# Patient Record
Sex: Male | Born: 1985 | Race: White | Hispanic: No | Marital: Single | Smoking: Never smoker
Health system: Southern US, Community
[De-identification: ages and names within clinical notes are randomized; demographics above are authoritative.]

## PROBLEM LIST (undated history)

## (undated) HISTORY — PX: OTHER SURGICAL HISTORY: SHX169

---

## 2008-10-11 ENCOUNTER — Encounter (INDEPENDENT_AMBULATORY_CARE_PROVIDER_SITE_OTHER): Payer: Self-pay | Admitting: Occupational Medicine

## 2008-10-12 ENCOUNTER — Ambulatory Visit: Payer: Self-pay | Admitting: Family Medicine

## 2008-10-12 DIAGNOSIS — I498 Other specified cardiac arrhythmias: Secondary | ICD-10-CM | POA: Insufficient documentation

## 2008-10-12 DIAGNOSIS — R55 Syncope and collapse: Secondary | ICD-10-CM | POA: Insufficient documentation

## 2008-10-13 ENCOUNTER — Ambulatory Visit: Payer: Self-pay | Admitting: Cardiology

## 2008-10-13 LAB — CONVERTED CEMR LAB
BUN: 19 mg/dL (ref 6–23)
CO2: 24 meq/L (ref 19–32)
Cholesterol: 180 mg/dL (ref 0–200)
Glucose, Bld: 91 mg/dL (ref 70–99)
MCHC: 34.1 g/dL (ref 30.0–36.0)
RDW: 12.9 % (ref 11.5–15.5)
Sodium: 139 meq/L (ref 135–145)
Total Bilirubin: 0.8 mg/dL (ref 0.3–1.2)
Total Protein: 7.6 g/dL (ref 6.0–8.3)
Triglycerides: 85 mg/dL (ref <150)
VLDL: 17 mg/dL (ref 0–40)

## 2008-10-26 ENCOUNTER — Encounter: Payer: Self-pay | Admitting: Cardiology

## 2008-10-26 ENCOUNTER — Ambulatory Visit: Payer: Self-pay

## 2010-05-02 ENCOUNTER — Ambulatory Visit: Payer: Self-pay | Admitting: Family Medicine

## 2010-05-02 DIAGNOSIS — L219 Seborrheic dermatitis, unspecified: Secondary | ICD-10-CM

## 2010-05-03 LAB — CONVERTED CEMR LAB
ALT: 14 units/L (ref 0–53)
CO2: 26 meq/L (ref 19–32)
Calcium: 10.1 mg/dL (ref 8.4–10.5)
Chloride: 103 meq/L (ref 96–112)
Cholesterol: 182 mg/dL (ref 0–200)
Creatinine, Ser: 1.11 mg/dL (ref 0.40–1.50)
GC Probe Amp, Urine: NEGATIVE
Sodium: 137 meq/L (ref 135–145)
Total Protein: 7.9 g/dL (ref 6.0–8.3)

## 2011-01-01 ENCOUNTER — Ambulatory Visit
Admission: RE | Admit: 2011-01-01 | Discharge: 2011-01-01 | Payer: Self-pay | Source: Home / Self Care | Attending: Family Medicine | Admitting: Family Medicine

## 2011-01-01 ENCOUNTER — Encounter: Payer: Self-pay | Admitting: Family Medicine

## 2011-01-01 DIAGNOSIS — S60559A Superficial foreign body of unspecified hand, initial encounter: Secondary | ICD-10-CM | POA: Insufficient documentation

## 2011-01-16 NOTE — Assessment & Plan Note (Signed)
Summary: CPE   Vital Signs:  Patient profile:   25 year old male Height:      75.3 inches Weight:      239 pounds BMI:     29.74 O2 Sat:      99 % on Room air Pulse rate:   60 / minute BP sitting:   136 / 86  (left arm) Cuff size:   large  Vitals Entered By: Payton Spark CMA (May 02, 2010 9:39 AM)  O2 Flow:  Room air CC: CPE. Also check lump on scrotum, has been there unchanged x 2 years.    Primary Care Provider:  Nani Gasser MD  CC:  CPE. Also check lump on scrotum and has been there unchanged x 2 years. .  History of Present Illness: 25 yo WM presents for CPE.  He has noticed a lump on the R side of his scrotum that is hard and unchanged, non tender x 2 yrs.    He is physically active working for [employer].  He admits to a poor diet.  Monogamous with Girlfriend.  He is due for fasting labs and STD testing.  His last Tetanus vaccine was <10 yrs ago.  He has fam hx of premature heart dz.  He is not  a smoker.  Denies fam hx of prostate or colon cancer.  Current Medications (verified): 1)  None  Allergies (verified): No Known Drug Allergies  Past History:  Past Medical History: Reviewed history from 10/12/2008 and no changes required. None  Past Surgical History: Reviewed history from 10/12/2008 and no changes required. Groin hernia surgery bilat at age 51.    Family History: Reviewed history from 10/12/2008 and no changes required. Father wiht MI in his 38s, had a defibrillator Mother with HTN  Social History: Works for [employer]    HS degree. Single.  Has a girlfriend  Never Smoked Alcohol use-yes Drug use-no Regular exercise-yes  Review of Systems  The patient denies anorexia, fever, weight loss, weight gain, vision loss, decreased hearing, hoarseness, chest pain, syncope, dyspnea on exertion, peripheral edema, prolonged cough, headaches, hemoptysis, abdominal pain, melena, hematochezia, severe indigestion/heartburn, hematuria, incontinence, genital sores,  muscle weakness, suspicious skin lesions, transient blindness, difficulty walking, depression, unusual weight change, abnormal bleeding, enlarged lymph nodes, angioedema, breast masses, and testicular masses.    Physical Exam  General:  alert, well-developed, well-nourished, and well-hydrated.   Head:  normocephalic and atraumatic.   Eyes:  conjunctiva clear; PERRLA Ears:  EACs patent; TMs translucent and gray with good cone of light and bony landmarks.  Nose:  no nasal discharge.   Mouth:  good dentition and pharynx pink and moist.   Neck:  no masses.   Lungs:  Normal respiratory effort, chest expands symmetrically. Lungs are clear to auscultation, no crackles or wheezes. Heart:  Normal rate and regular rhythm. S1 and S2 normal without gallop, murmur, click, rub or other extra sounds. Abdomen:  Bowel sounds positive,abdomen soft and non-tender without masses, organomegaly or hernias noted. Genitalia:  circumcised, no hydrocele, no scrotal masses, and no cutaneous lesions.  small R testicular pearly papule - hard Pulses:  2+ radial and pedal pulses Extremities:  no LE edema Skin:  mild forehead/ scalp seborrhea with R temple SKs. flat skin lesion (scar) over the R eyebrow.  No erythema, scaling or vesicular formation.  Cervical Nodes:  No lymphadenopathy noted Psych:  good eye contact.     Impression & Recommendations:  Problem # 1:  PHYSICAL EXAMINATION (ICD-V70.0) Keeping healthy  checklist for men reviewed. BP in the pre-HTN range.  BMI 29= overwt. Tetanus UTD. Update fasting labs.   STD testing done. MVI daily. Healthy diet, regular exercise.  Other Orders: T-Comprehensive Metabolic Panel [id number]) T-Lipid Profile [id number]) T-HIV Antibody  (Reflex) [id number]) T-RPR (Syphilis) [id number]) T-Chlamydia & GC Probe, Urine ([id number])

## 2011-01-18 NOTE — Letter (Signed)
Summary: Out of Work  [facility]  [address]   [place], [place] [address]   Phone: [phone]  Fax: [phone]    January 01, 2011   Employee:  HAEDEN HUDOCK    To Whom It May Concern:   For Medical reasons, please excuse the above named employee from work for the following dates:  Start:   01-01-2011  End:   01-02-2011  If you need additional information, please feel free to contact our office.         Sincerely,    Nani Gasser MD

## 2011-01-18 NOTE — Assessment & Plan Note (Signed)
Summary: Phayrngitis, Foreign Body removal   Vital Signs:  Patient profile:   25 year old male Height:      75.3 inches Weight:      237 pounds Pulse rate:   84 / minute BP sitting:   151 / 82  (right arm) Cuff size:   large  Vitals Entered By: Avon Gully CMA, Duncan Dull) (January 01, 2011 4:18 PM) CC: st since thurday, legs feel achey, throat hurts worse today   Primary Care Provider:  Nani Gasser MD  CC:  st since thurday, legs feel achey, and throat hurts worse today.  History of Present Illness: st since thurday (5 days), legs feel achey, throat hurts worse today. Taking lozenges. Throat itches. No real cough.  Has been worse over the weekend.  No nasal congestion. No fever.  No HA or nausea.     Has a peice of metal in her right hand since July. Went to [facility] after teh injury and had an xray. Saw a peice od metal and was told to give it time. It would likely work its way out voer next couple o fmonths. It moved to the surface but never came out. Tried to take it out home.   Current Medications (verified): 1)  None  Allergies (verified): No Known Drug Allergies  Comments:  Nurse/Medical Assistant: The patient's medications and allergies were reviewed with the patient and were updated in the Medication and Allergy Lists. Avon Gully CMA, Duncan Dull) (January 01, 2011 4:19 PM)  Physical Exam  General:  Well-developed,well-nourished,in no acute distress; alert,appropriate and cooperative throughout examination Head:  Normocephalic and atraumatic without obvious abnormalities. No apparent alopecia or balding. Eyes:  No corneal or conjunctival inflammation noted. EOMI. Perrla.  Ears:  External ear exam shows no significant lesions or deformities.  Otoscopic examination reveals clear canals, tympanic membranes are intact bilaterally without bulging, retraction, inflammation or discharge. Hearing is grossly normal bilaterally. Nose:  External nasal examination  shows no deformity or inflammation.  Mouth:  OP with some mildl erythema.  Neck:  No deformities, masses, or tenderness noted. Lungs:  Normal respiratory effort, chest expands symmetrically. Lungs are clear to auscultation, no crackles or wheezes. Heart:  Normal rate and regular rhythm. S1 and S2 normal without gallop, murmur, click, rub or other extra sounds. Skin:  no rashes. Left hand near the base of his thumb able to palpate the edge of a peice of metal.   Cervical Nodes:  No lymphadenopathy noted Psych:  Cognition and judgment appear intact. Alert and cooperative with normal attention span and concentration. No apparent delusions, illusions, hallucinations   Impression & Recommendations:  Problem # 1:  PHARYNGITIS (ICD-462)  Rapid strep neg. Treat as viral. Call if nto better by the end of the week.    Orders: Rapid Strep (81191)  Problem # 2:  FOREIGN BODY, HAND (ICD-914.6)  See procedue note. Pt toelrated well and metal peiced removed successfull. Steristrips placed and f/u wound care instructions given.   Orders: Removal of Foreign Body Simple (10120)   Orders Added: 1)  Rapid Strep [47829] 2)  Est. Patient Level III [56213] 3)  Removal of Foreign Body Simple [10120]     Procedure Note  Foreign Body Removal: The patient complains of pain and irritation but denies discharge and fever. Date of onset: 06/16/2010 Indication: painful lesion Work related: yes  Procedure # 1: incision & FB removal    Type of foreign body: metal    Region: Left hand  Location: base of thumb     Anesthesia: 1% lidocaine w/epinephrine    Closure: steri-strips  Cleaned and prepped with: alcohol and betadine Instructions: daily dressing changes  Laboratory Results  Date/Time Received: 01/01/11 Date/Time Reported: 01/01/11

## 2011-05-01 NOTE — Assessment & Plan Note (Signed)
[name] [name]                            CARDIOLOGY OFFICE NOTE   NAME:[name], [name] CREMER                        MRN:          [id number]  DATE:10/13/2008                            DOB:          1986/02/18    The patient is a pleasant 25 year old gentleman with no prior cardiac  history and I was asked to evaluate for syncope.  Note, he typically  does not have dyspnea on exertion, orthopnea, PND, pedal edema,  presyncope, syncope, or exertional chest pain.  He does state that after  exercise and occasionally he will feel his heart rate slow down for  approximately 10 seconds as well as beat hard.  However, this is not  associated with any syncope.  This past Sunday which was October 10, 2008, the patient was in the shower.  He was washing his hair with his  head bent over.  After bringing his head up, he developed the same  sensation of his heart rate dropping followed by frank syncopal episode.  There was no associated chest pain, shortness of breath, or nausea or  vomiting.  He did not lose strength or sensation in his extremities nor  he had any seizure activity.  He did hit the back of his neck and  chipped a tooth by his report.  He was out for approximately 10 seconds  to 30 seconds and felt fine afterwards.  Note, he had not had a bowel  movement or had not urinated.  Because of the above, we were asked to  further evaluate.   MEDICATIONS:  The patient is on no medications at present.   ALLERGIES:  He has no known drug allergies.   SOCIAL HISTORY:  He does not smoke.  He rarely consumes alcohol.  He  denies any drug use.   FAMILY HISTORY:  Positive for coronary artery disease in his father.   PAST MEDICAL HISTORY:  There is no diabetes mellitus, hypertension, or  hyperlipidemia.  There is no other medical problems noted.  He has had a  history of bilateral inguinal hernia repair as a child.   REVIEW OF SYSTEMS:  He denies any headaches or  fevers or chills.  There  is no productive cough or hemoptysis.  There is no dysphagia,  odynophagia, melena, or hematochezia.  There is no dysuria or hematuria.  There is no rash or seizure activity.  There is no orthopnea, PND, or  pedal edema.  The remaining systems are negative.   PHYSICAL EXAMINATION:  VITAL SIGNS:  Blood pressure of 130/64 and his  pulse is 56.  GENERAL:  He is well developed, well nourished in no acute distress.  SKIN:  Warm and dry.  He does not appear to be depressed.  There is no  peripheral clubbing.  BACK:  Normal.  HEENT:  Normal with normal eyelids.  NECK:  Supple with a normal upstroke bilaterally.  No bruits noted.  There is no jugular venous distention.  I cannot appreciate thyromegaly.  CHEST:  Clear to auscultation.  Normal expansion.  CARDIOVASCULAR:  Regular rate and rhythm.  Normal S1 and S2.  There are  no murmurs, rubs, or gallops noted.  ABDOMEN:  Nontender and nondistended.  Positive bowel sounds.  No  hepatosplenomegaly.  No mass appreciated.  There is no abdominal bruit.  He has 2+ femoral pulses bilaterally.  No bruits.  EXTREMITIES:  No edema.  I could palpate no cords.  He has 2+ dorsalis  pedis pulses bilaterally.  NEUROLOGIC:  Grossly intact.   His electrocardiogram is not available at this time and we are awaiting  on a transmission from Dr. Shelah Lewandowsky office.   DIAGNOSES:  Recent syncopal episode - [name] [name] had a syncopal episode 3  days ago that sounds to potentially be vagal in etiology.  He states  that he felt his heart rate slow down and then felt dizzy and passed  out.  He has had no subsequent episodes.  We will await his  electrocardiogram.  If it is normal, then we will plan to proceed with  an echocardiogram to quantify his left ventricular function.  If that is  normal, then we will follow him expectantly.  If he has recurrent  episodes in the future, then we may need to proceed with an event  monitor.  I have  instructed him not to drive until he has an  echocardiogram performed. Certainly, his left ventricular function were  reduced and we would need to rethink the above.  I have also instructed  him on the importance of maintaining good hydration and salt intake.  We  will see him back in approximately 12 weeks to review his symptoms.     Madolyn Frieze Jens Som, MD, [facility]  Electronically Signed    BSC/MedQ  DD: 10/13/2008  DT: 10/14/2008  Job #: [id number]   cc:   Nani Gasser, M.D.

## 2011-07-31 ENCOUNTER — Encounter: Payer: Self-pay | Admitting: Cardiology

## 2011-09-25 ENCOUNTER — Ambulatory Visit (INDEPENDENT_AMBULATORY_CARE_PROVIDER_SITE_OTHER): Payer: BC Managed Care – PPO | Admitting: Family Medicine

## 2011-09-25 ENCOUNTER — Encounter: Payer: Self-pay | Admitting: Family Medicine

## 2011-09-25 ENCOUNTER — Ambulatory Visit
Admission: RE | Admit: 2011-09-25 | Discharge: 2011-09-25 | Disposition: A | Discharge status: Home / Self Care | Payer: BC Managed Care – PPO | Source: Ambulatory Visit | Attending: Family Medicine | Admitting: Family Medicine

## 2011-09-25 VITALS — BP 134/67 | HR 96 | Wt 207.0 lb

## 2011-09-25 DIAGNOSIS — N50811 Right testicular pain: Secondary | ICD-10-CM

## 2011-09-25 DIAGNOSIS — N509 Disorder of male genital organs, unspecified: Secondary | ICD-10-CM

## 2011-09-25 LAB — POCT URINALYSIS DIPSTICK
Bilirubin, UA: NEGATIVE
Glucose, UA: NEGATIVE
Leukocytes, UA: NEGATIVE
Nitrite, UA: NEGATIVE

## 2011-09-25 NOTE — Patient Instructions (Signed)
We will call you with the results 

## 2011-09-25 NOTE — Progress Notes (Signed)
  Subjective:    Patient ID: [name] [name], male    DOB: 07-28-1986, 25 y.o.   MRN: [id number]  HPI  Double hernia repair as a child. He works out regularly. Right testicle with lots of pressure, started yesterday. Felt a pain when tried work out this morning. No swelling or bulge.  No discomfort in the groin crease area.  No urinary sxs or fever. No pain relievers. No alleviating sxs. Hasn't taken any meds for pain. More comfortable at rest.   Review of Systems     Objective:   Physical Exam  Right testicle is normal in size and appearance. No nodule or tenderness. Normal palpation of the cords. Neg hernia exam.        Assessment & Plan:  Right testicular pain- Unclear etiology. Exam is normal. No palpable hernia on exam. No urinary sxs and UA is neg.  Will schedule for [place] for further w/u. If neg will refer to urology for further eval. Avoid working out until we get the results back. [name] use aleve or IBU as needed.

## 2012-04-15 ENCOUNTER — Ambulatory Visit (INDEPENDENT_AMBULATORY_CARE_PROVIDER_SITE_OTHER): Payer: BC Managed Care – PPO | Admitting: Family Medicine

## 2012-04-15 ENCOUNTER — Encounter: Payer: Self-pay | Admitting: Family Medicine

## 2012-04-15 VITALS — BP 136/72 | HR 70 | Ht 72.0 in | Wt 211.0 lb

## 2012-04-15 DIAGNOSIS — T148XXA Other injury of unspecified body region, initial encounter: Secondary | ICD-10-CM

## 2012-04-15 NOTE — Patient Instructions (Signed)
Aleve or IBuprofen for inflammation and pain relief.   Keep icing it Call if need extension on the work notes.

## 2012-04-15 NOTE — Progress Notes (Signed)
  Subjective:    Patient ID: [name] [name], male    DOB: 04-25-1986, 26 y.o.   MRN: [id number]  HPI Right thigh pain - Felt pain during his work out and kept going.  Iced it all last night.  Woke up and changed the ice pack.  Still very painful.  Can walk normally but can't do lunge or squat without sig pain.  HAsn't taken Aleve or IBU.  Says normally can stretch, massage and ice of muscle and it usually resolves very quickly. This has not and so he was concerned.   Review of Systems     Objective:   Physical Exam  Constitutional: He appears well-developed.  Musculoskeletal:       Right thigh with normal range of motion of the hip, knee and ankle. Strength is 5 over 5 in the hips, knees, ankles bilaterally. He is nontender over the thigh itself. He does have pain with resisted flexion. No bruising or skin lesions.           Assessment & Plan:  Right Thigh muscle tear - Continue ice and massage the muscle.  Start IBU and Aleve as well.  Call if not better in 1-2 weeks.  Recommend conservative therapy for the next one to 2 weeks. 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 If he needs a couple more days or extension on his work note then I will be happy to do that. Work note given.

## 2012-10-17 IMAGING — US US SCROTUM
1 series · 14 of 25 positions shown · non-contrast
Comparison: None.

CLINICAL DATA: Right testicular pressure with exercise

ULTRASOUND OF SCROTUM
TECHNIQUE: Complete ultrasound examination of the testicles,
epididymis, and other scrotal structures was performed.

[Series 1: us scrotum · 0.08mm/px · 14 of 57 slices shown]
[im 1/57]
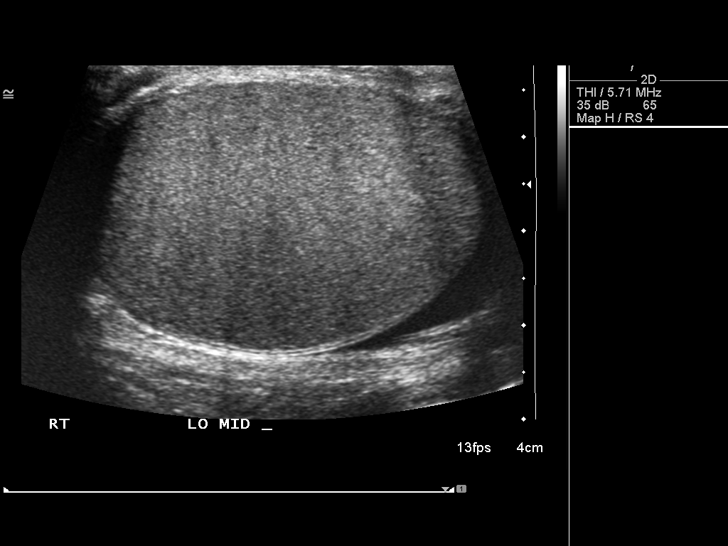
[im 5/57]
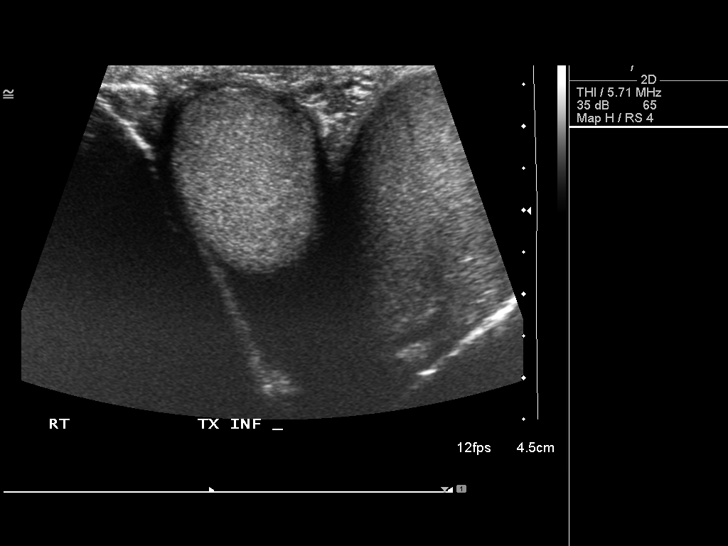
[im 10/57]
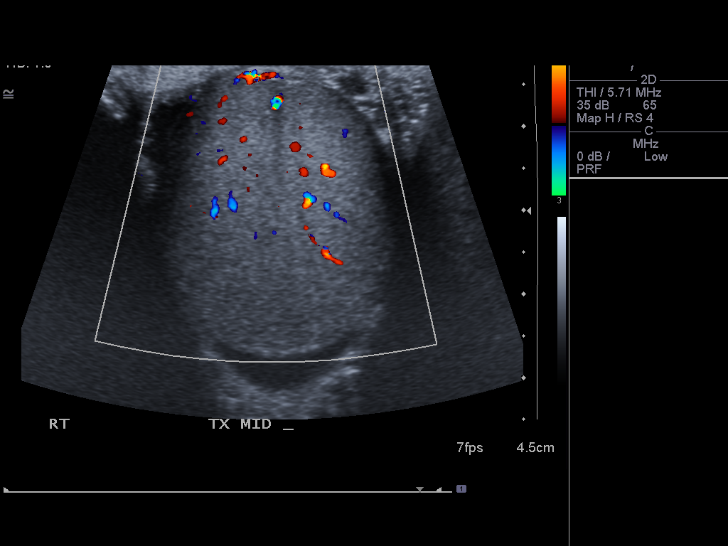
[im 15/57]
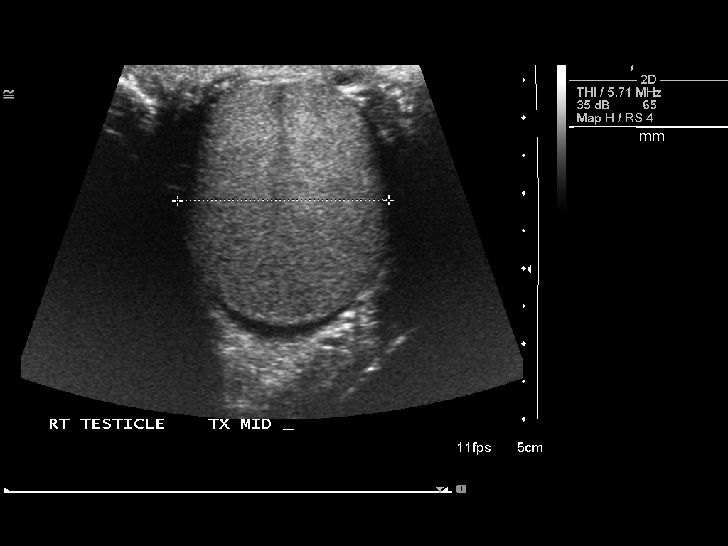
[im 19/57]
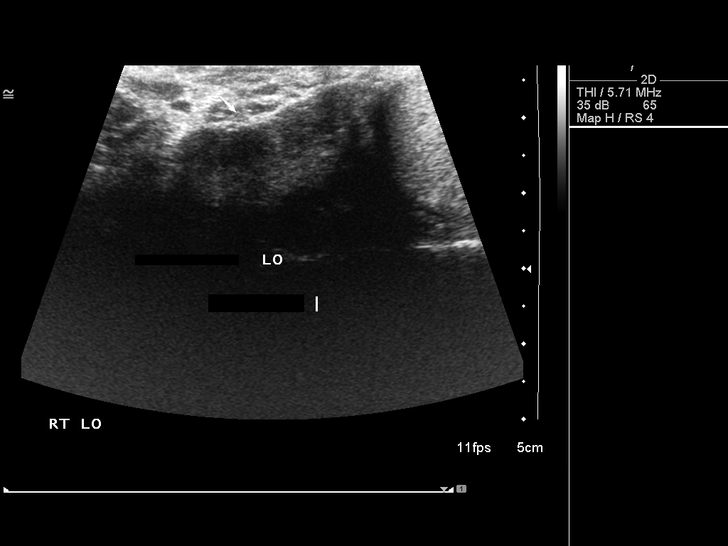
[im 22/57]
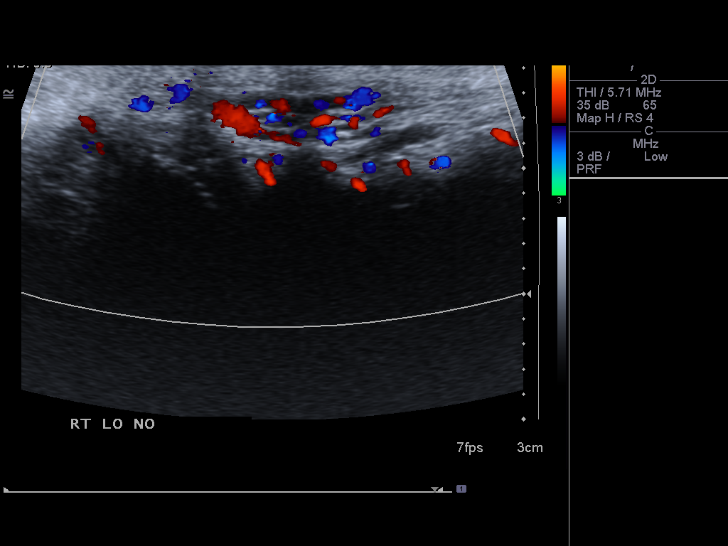
[im 26/57]
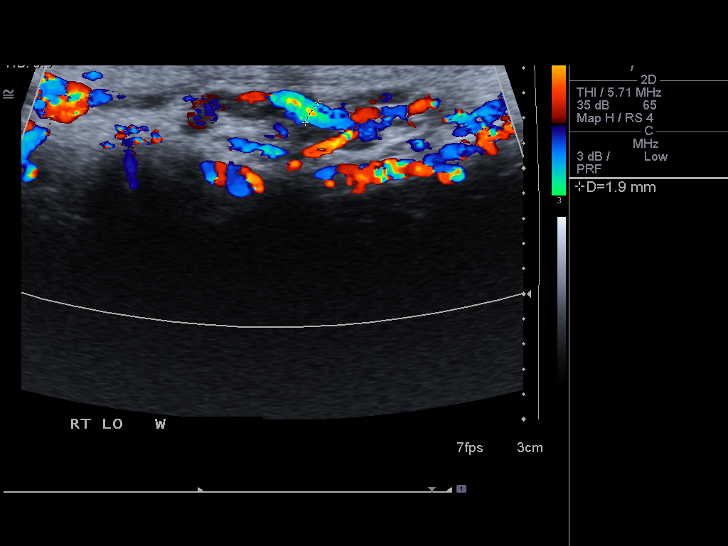
[im 31/57]
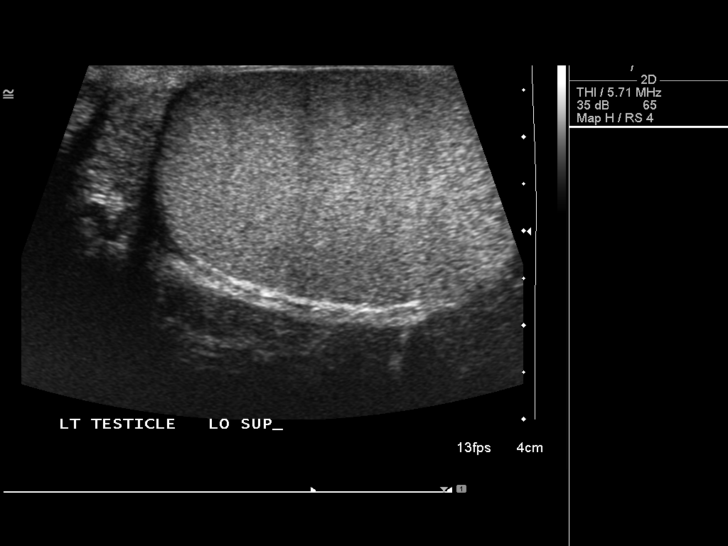
[im 36/57]
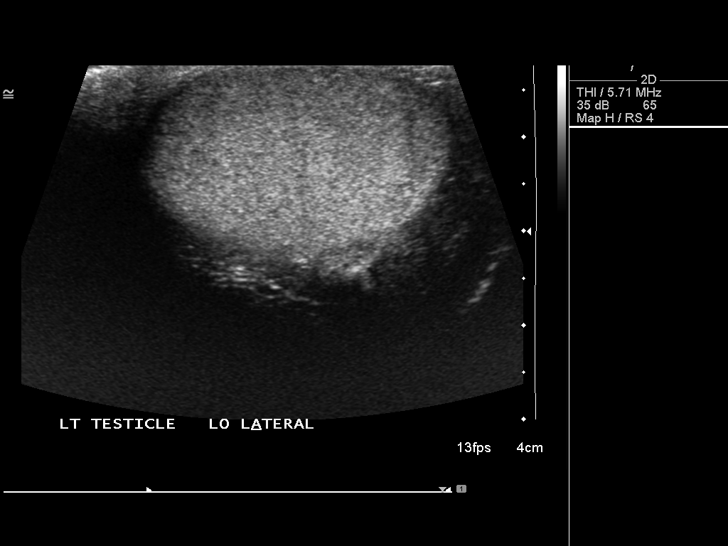
[im 38/57]
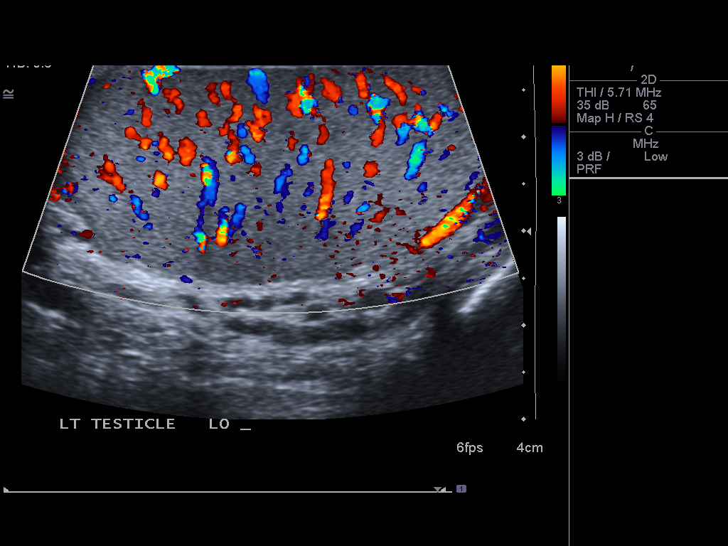
[im 43/57]
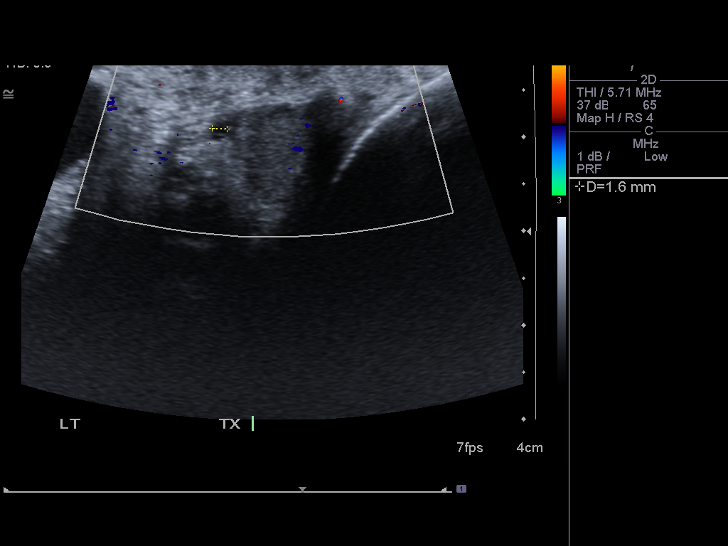
[im 47/57]
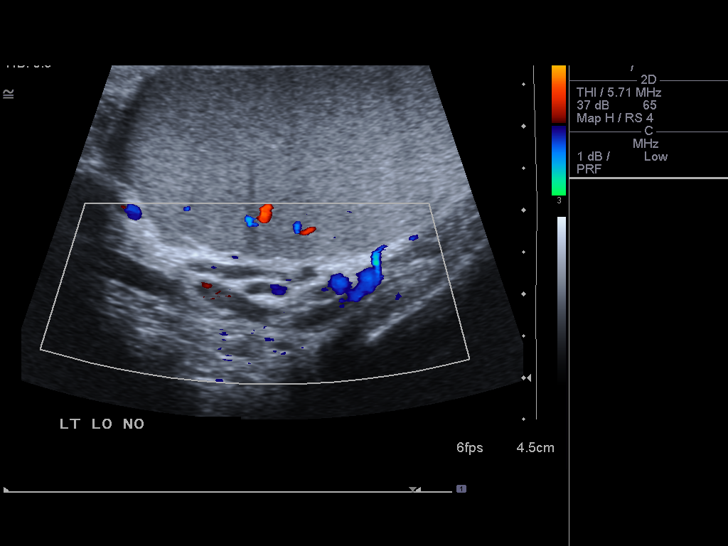
[im 52/57]
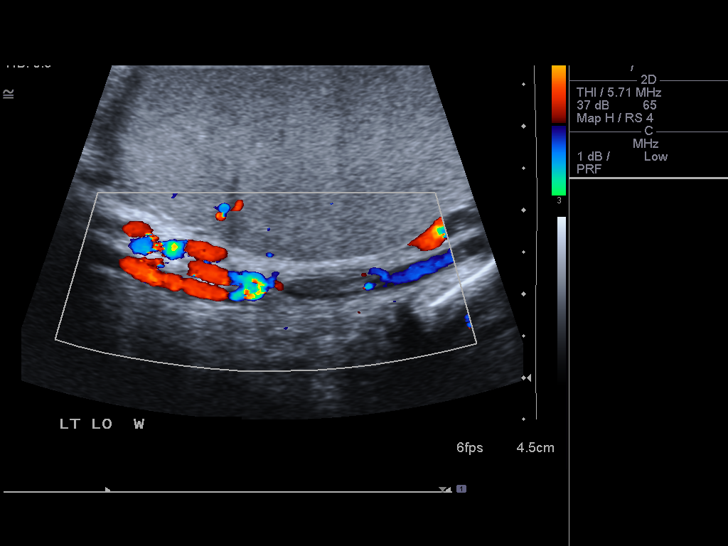
[im 57/57]
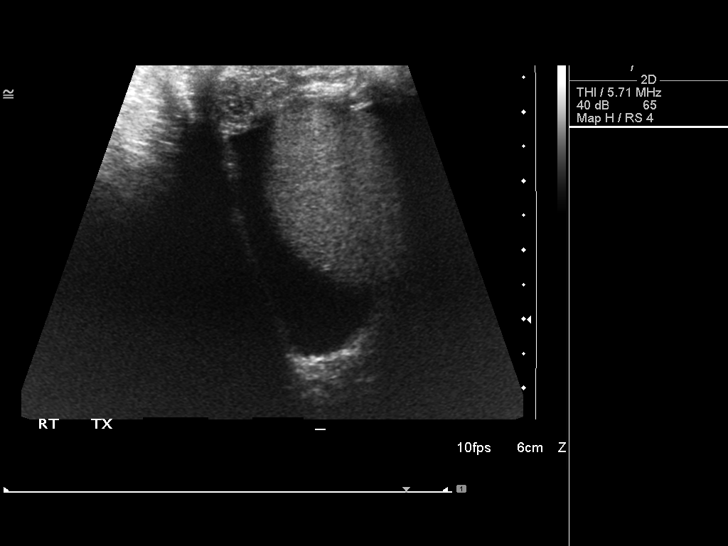

[14 of 25 positions shown; findings below may reference images not displayed]

FINDINGS: Right testis:  The right testicle measures 4.1 x 2.9 x 2.8 cm.  The
echogenicity of the right testicle is normal and no intratesticular
abnormality is seen.  Blood flow is noted to the right testicle.

Left testis:  The left testicle measures 4.3 x 2.7 x 2.7 cm with no
intratesticular abnormality.  Blood flow is noted to the left
testicle as well.

Right epididymis:  The right epididymis is unremarkable.

Left epididymis:  There is a 2 mm left epididymal cyst present.

Hydrocele:  Small amount of fluid is noted on the right.

Varicocele:  Slightly prominent veins are present bilaterally, but
they do not measure large enough to diagnose varicocele.  These
could represent early varicocele formation however.
IMPRESSION: 1.  No intratesticular abnormality.  Blood flow is noted to both
testicles.
2.  Small amount of fluid in the right scrotum.
3.  Slightly prominent veins which do augment.  Possible early
varicocele.

## 2014-04-23 ENCOUNTER — Ambulatory Visit: Payer: Self-pay | Admitting: Internal Medicine

## 2014-04-23 VITALS — BP 126/70 | HR 71 | Temp 97.9°F | Resp 14 | Ht 74.0 in | Wt 216.0 lb

## 2014-04-23 DIAGNOSIS — Z0289 Encounter for other administrative examinations: Secondary | ICD-10-CM

## 2014-04-23 NOTE — Patient Instructions (Signed)
DASH Diet  The DASH diet stands for "Dietary Approaches to Stop Hypertension." It is a healthy eating plan that has been shown to reduce high blood pressure (hypertension) in as little as 14 days, while also possibly providing other significant health benefits. These other health benefits include reducing the risk of breast cancer after menopause and reducing the risk of type 2 diabetes, heart disease, colon cancer, and stroke. Health benefits also include weight loss and slowing kidney failure in patients with chronic kidney disease.   DIET GUIDELINES  · Limit salt (sodium). Your diet should contain less than 1500 mg of sodium daily.  · Limit refined or processed carbohydrates. Your diet should include mostly whole grains. Desserts and added sugars should be used sparingly.  · Include small amounts of heart-healthy fats. These types of fats include nuts, oils, and tub margarine. Limit saturated and trans fats. These fats have been shown to be harmful in the body.  CHOOSING FOODS   The following food groups are based on a 2000 calorie diet. See your Registered Dietitian for individual calorie needs.  Grains and Grain Products (6 to 8 servings daily)  · Eat More Often: Whole-wheat bread, brown rice, whole-grain or wheat pasta, quinoa, popcorn without added fat or salt (air popped).  · Eat Less Often: White bread, white pasta, white rice, cornbread.  Vegetables (4 to 5 servings daily)  · Eat More Often: Fresh, frozen, and canned vegetables. Vegetables may be raw, steamed, roasted, or grilled with a minimal amount of fat.  · Eat Less Often/Avoid: Creamed or fried vegetables. Vegetables in a cheese sauce.  Fruit (4 to 5 servings daily)  · Eat More Often: All fresh, canned (in natural juice), or frozen fruits. Dried fruits without added sugar. One hundred percent fruit juice (½ cup [237 mL] daily).  · Eat Less Often: Dried fruits with added sugar. Canned fruit in light or heavy syrup.  Lean Meats, Fish, and Poultry (2  servings or less daily. One serving is 3 to 4 oz [85-114 g]).  · Eat More Often: Ninety percent or leaner ground beef, tenderloin, sirloin. Round cuts of beef, chicken breast, turkey breast. All fish. Grill, bake, or broil your meat. Nothing should be fried.  · Eat Less Often/Avoid: Fatty cuts of meat, turkey, or chicken leg, thigh, or wing. Fried cuts of meat or fish.  Dairy (2 to 3 servings)  · Eat More Often: Low-fat or fat-free milk, low-fat plain or light yogurt, reduced-fat or part-skim cheese.  · Eat Less Often/Avoid: Milk (whole, 2%). Whole milk yogurt. Full-fat cheeses.  Nuts, Seeds, and Legumes (4 to 5 servings per week)  · Eat More Often: All without added salt.  · Eat Less Often/Avoid: Salted nuts and seeds, canned beans with added salt.  Fats and Sweets (limited)  · Eat More Often: Vegetable oils, tub margarines without trans fats, sugar-free gelatin. Mayonnaise and salad dressings.  · Eat Less Often/Avoid: Coconut oils, palm oils, butter, stick margarine, cream, half and half, cookies, candy, pie.  FOR MORE INFORMATION  The Dash Diet Eating Plan: www.dashdiet.org  Document Released: 11/22/2011 Document Revised: 02/25/2012 Document Reviewed: 11/22/2011  ExitCare® Patient Information ©2014 [employer].

## 2014-04-23 NOTE — Progress Notes (Signed)
   Subjective:    Patient ID: [name] [name], male    DOB: 1986-10-15, 28 y.o.   MRN: [id number]  HPI 28 y.o. 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 Denies taking any medications or allergies. No hx of any cardiac, pumonary, or orthopedic problems.  Review of Systems  Constitutional: Negative.   HENT: Negative.   Eyes: Negative.   Respiratory: Negative.   Cardiovascular: Negative.   Gastrointestinal: Negative.   Endocrine: Negative.   Genitourinary: Negative.   Musculoskeletal: Negative.   Skin: Negative.   Allergic/Immunologic: Negative.   Neurological: Negative.   Hematological: Negative.   Psychiatric/Behavioral: Negative.        Objective:   Physical Exam  Constitutional: He is oriented to person, place, and time. He appears well-developed and well-nourished.  HENT:  Head: Normocephalic and atraumatic.  Right Ear: External ear normal.  Left Ear: External ear normal.  Mouth/Throat: Oropharynx is clear and moist.  Eyes: Conjunctivae and EOM are normal. Pupils are equal, round, and reactive to light.  Neck: Normal range of motion. No thyromegaly present.  Cardiovascular: Normal rate, regular rhythm and normal heart sounds.  Exam reveals no gallop.   No murmur heard. Pulmonary/Chest: Effort normal and breath sounds normal.  Abdominal: Soft. Bowel sounds are normal.  Genitourinary: Penis normal.  Musculoskeletal: Normal range of motion.  Lymphadenopathy:    He has no cervical adenopathy.  Neurological: He is alert and oriented to person, place, and time. No cranial nerve deficit. He exhibits normal muscle tone. Coordination normal.  Psychiatric: He has a normal mood and affect. His behavior is normal. Judgment and thought content normal.          Assessment & Plan:  Healthy 1 year dot

## 2014-04-23 NOTE — Progress Notes (Signed)
   Subjective:    Patient ID: [name] [name], male    DOB: 1986/10/21, 28 y.o.   MRN: [id number]  HPI    Review of Systems     Objective:   Physical Exam        Assessment & Plan:

## 2017-05-09 NOTE — Unmapped External Note (Addendum)
 EXCISION GANGLION RIGHT ANKLE  Operative Note (CSN: [id number])  Service  Date of Surgery: 05/09/2017 Admit Date: 05/09/2017 Performing Service: Orthopedics Surgeon(s) and Role:    * [name] Velma Bamberg, MD    * [name] Carlin Fonder, MD - Primary   Op Note  Pre-op Diagnosis: right ankle ganglion  Post-op Diagnosis: Right ankle ganglion, greater than 5 cm, subfascial  Procedure: EXCISION SOFT TISSUE TUMOR RIGHT ANKLE, SUBFASCIAL GREATER than 5 cm  Anesthesia: Preop Block + Monitor Anesthesia Care  Assistant:  Bethanie, PAC (present for entire case; assisted with patient positioning, prep and drape, extremity repositioning intraoperatively, retraction, closure, dressing application, boot application)  Estimated Blood Loss: 2 mL  Specimens:  ID Type Source Tests Collected by Time Destination  1 :  Tissue Ganglion Cyst SURGICAL PATHOLOGY EXAM [name] Carlin Fonder, MD 05/09/2017 [phone]     Indications for Surgery: 31 year old white male with large painful prominent mass right ankle. Has had some recurrent injuries/sprains in this area but nothing focal. Failed conservative treatment including previous aspirations. Risks benefits and options to the above procedure were discussed with the patient and he wished to proceed.   Intra operative findings: Typical large ganglion appearance.  6 cm by 2.5 cm x 3 cm  Implants:  * No implants in log *  Procedure in detail: After informed consent was obtained, patient had the extremity signed and was taken to PACU where regional block was administered per anesthesia. He was then taken to the operating room and placed in supine position. Right lower extremity was prepped and draped in usual orthopedic sterile fashion. Timeout was performed and site was verified. After verification of appropriate level of anesthesia, skin incision was created in Langer's lines and taken just through the skin sharply. Blunt dissection was carried down. The lateralmost  aspect of the incision was found to contain the superficial peroneal nerve which was protected with retractors throughout the case. The cyst was easily identifiable. It was circumferentially dissected free of surrounding soft tissue. The stalk appeared to come from the extensor retinaculum and went down to the extensor digitorum longus muscle belly subfascial. Once the mass was excised, I explored the area and saw no evidence of joint involvement. Wound was irrigated. Hemostasis was achieved. The wound was closed with 4-0 Biosyn subcutaneous sutures and 4-0 Biosyn subcuticular skin closure with liquid band. Sterile dressing was applied. Tourniquet was deflated. Patient tolerated the procedure well. He was aroused and taken to the recovery room in good condition.  Complications: * No complications entered in OR log *  Drains: None  Disposition: To PACU in good condition.   [name] KYM Fonder, MD Date: 05/09/2017  Time: 8:19 AM    Note - This record has been created using [employer]. Chart creation errors have been sought, but may not always have been located. Such creation errors do not reflect on the standard of medical care.

## 2023-12-26 ENCOUNTER — Ambulatory Visit (INDEPENDENT_AMBULATORY_CARE_PROVIDER_SITE_OTHER): Payer: BC Managed Care – PPO | Admitting: Podiatry

## 2023-12-26 ENCOUNTER — Encounter: Payer: Self-pay | Admitting: Podiatry

## 2023-12-26 DIAGNOSIS — L600 Ingrowing nail: Secondary | ICD-10-CM

## 2023-12-26 DIAGNOSIS — L603 Nail dystrophy: Secondary | ICD-10-CM

## 2023-12-26 NOTE — Progress Notes (Signed)
  Subjective:  Patient ID: [name] [name], male    DOB: 11/19/1986,   MRN: [id number]  No chief complaint on file.   38 y.o. male presents for concern of painful damgaged toenails. Relates he has been dealing with these nails for a while. He runs and relates they regularly fall off and grow in abnormal. He does relates some pain in the great toes and some of the lesser toes. Does relates he would like to remove all nails. He has tried managing himself but not working.  . Denies any other pedal complaints. Denies n/v/f/c.   History reviewed. No pertinent past medical history.  Objective:  Physical Exam: Vascular: DP/PT pulses 2/4 bilateral. CFT <3 seconds. Normal hair growth on digits. No edema.  Skin. No lacerations or abrasions bilateral feet. Bilateral great toenails dystrophic and incurvated. Remaining nails 2-5 bilateral are brittle and dystrophic.  Musculoskeletal: MMT 5/5 bilateral lower extremities in DF, PF, Inversion and Eversion. Deceased ROM in DF of ankle joint.  Neurological: Sensation intact to light touch.   Assessment:   1. Onychodystrophy   2. Ingrown left greater toenail   3. Ingrown right greater toenail      Plan:  Patient was evaluated and treated and all questions answered. Discussed ingrown toenails etiology and treatment options including procedure for removal vs conservative care.  Patient requesting removal of ingrown nail today. Procedure below.  Discussed procedure and post procedure care and patient expressed understanding.  Will follow-up in 2 weeks for nail check or sooner if any problems arise.    Procedure:  Procedure: total Nail Avulsion of bilaterally hallux nail Surgeon: Asberry Failing, DPM  Pre-op Dx: Ingrown toenail without infection Post-op: Same  Place of Surgery: Office exam room.  Indications for surgery: Painful and ingrown toenail.    The patient is requesting removal of nail with  chemical matrixectomy. Risks and complications  were discussed with the patient for which they understand and written consent was obtained. Under sterile conditions a total of 3 mL of  1% lidocaine plain was infiltrated in a hallux block fashion. Once anesthetized, the skin was prepped in sterile fashion. A tourniquet was then applied. Next the entire hallux nail was removed bilateral.  Next phenol was then applied under standard conditions to permanently destroy the matrix and copiously irrigated. Silvadene was applied. A dry sterile dressing was applied. After application of the dressing the tourniquet was removed and there is found to be an immediate capillary refill time to the digit. The patient tolerated the procedure well without any complications. Post procedure instructions were discussed the patient for which he verbally understood. Follow-up in two weeks for nail check or sooner if any problems are to arise. Discussed signs/symptoms of infection and directed to call the office immediately should any occur or go directly to the emergency room. In the meantime, encouraged to call the office with any questions, concerns, changes symptoms.   Asberry Failing, DPM

## 2023-12-26 NOTE — Patient Instructions (Signed)

## 2024-01-10 ENCOUNTER — Ambulatory Visit (INDEPENDENT_AMBULATORY_CARE_PROVIDER_SITE_OTHER): Payer: BC Managed Care – PPO | Admitting: Podiatry

## 2024-01-10 DIAGNOSIS — L600 Ingrowing nail: Secondary | ICD-10-CM

## 2024-01-10 DIAGNOSIS — L603 Nail dystrophy: Secondary | ICD-10-CM

## 2024-01-10 NOTE — Progress Notes (Signed)
  Subjective:  Patient ID: ERIVERTO BYRNES, male    DOB: 1986/03/30,   MRN: [id number]  No chief complaint on file.   38 y.o. male presents for follow-up of bilateral hallux nail avulsion. Relates doing well with minimal pain. Has been soaking as instructed.  . Denies any other pedal complaints. Denies n/v/f/c.   No past medical history on file.  Objective:  Physical Exam: Vascular: DP/PT pulses 2/4 bilateral. CFT <3 seconds. Normal hair growth on digits. No edema.  Skin. No lacerations or abrasions bilateral feet. Bilateral hallux nails healing well.  Musculoskeletal: MMT 5/5 bilateral lower extremities in DF, PF, Inversion and Eversion. Deceased ROM in DF of ankle joint.  Neurological: Sensation intact to light touch.   Assessment:   1. Onychodystrophy   2. Ingrown right greater toenail   3. Ingrown left greater toenail      Plan:  Patient was evaluated and treated and all questions answered. Toe was evaluated and appears to be healing well.  May discontinue soaks and neosporin.  Patient to follow-up as needed.    Louann Sjogren, DPM

## 2024-09-09 ENCOUNTER — Ambulatory Visit: Payer: Self-pay | Attending: Cardiology | Admitting: Cardiology

## 2024-09-09 ENCOUNTER — Encounter: Payer: Self-pay | Admitting: Cardiology

## 2024-09-09 VITALS — BP 139/78 | HR 94 | Resp 16 | Ht 74.0 in | Wt 217.2 lb

## 2024-09-09 DIAGNOSIS — Z8249 Family history of ischemic heart disease and other diseases of the circulatory system: Secondary | ICD-10-CM | POA: Diagnosis not present

## 2024-09-09 DIAGNOSIS — I10 Essential (primary) hypertension: Secondary | ICD-10-CM | POA: Diagnosis not present

## 2024-09-09 DIAGNOSIS — Z13220 Encounter for screening for lipoid disorders: Secondary | ICD-10-CM

## 2024-09-09 MED ORDER — LOSARTAN POTASSIUM 25 MG PO TABS
25.0000 mg | ORAL_TABLET | Freq: Every morning | ORAL | 3 refills | Status: DC
Start: 2024-09-09 — End: 2024-10-05

## 2024-09-09 NOTE — Progress Notes (Signed)
 Cardiology Office Note:    Date:  09/09/2024  NAME:  [name] [name]    MRN: [id number] DOB:  10-Jul-1986   PCP:  Default, Provider, MD  Former Cardiology Providers: None Primary Cardiologist:  Madonna Large, DO, [facility] (established care 09/09/2024) Electrophysiologist:  None   Referring MD: None, self-referred Reason of Consult: Hypertension, history of heart disease, self-referral  Chief Complaint  Patient presents with   family history of CAD   New Patient (Initial Visit)    History of Present Illness:    [name] [name] is a 38 y.o. Caucasian male whose past medical history and cardiovascular risk factors includes: Family history of heart disease, hypertension.  Patient has self-referred into practice to establish care.    Patient is accompanied by his wife Curry is a Advice worker with a vascular surgery team.  Patient is requesting assistance with blood pressure management. States that he has had high blood pressures for at least 15 years. Initially SBP would be around 130 mmHg. Now his wife has noted that his SBP is in the mid 140 mmHg range. No significant fast food, canned foods. The eat out once or twice per week. Since he is an avid runner so he does supplement electrolytes and sodium to account for indiscretion loss.  He ran a 50K a couple days ago.  And averages about 40 miles of running per week.  Family history of coronary artery disease. States that the family has a history of elevated LP(a). Mom and Dad - HTN  DAD - CAD and CABG (in his 26s) 2 half sister - no known CAD   Current Medications: Current Meds  Medication Sig   losartan  (COZAAR ) 25 MG tablet Take 1 tablet (25 mg total) by mouth every morning.   Multiple Vitamin (MULTIVITAMIN) tablet Take 1 tablet by mouth daily.  Over-the-counter omega supplements 1280 mg 2 tabs daily  Allergies:    Patient has no known allergies.   Past Medical History: No past medical history on file.  Past  Surgical History: Past Surgical History:  Procedure Laterality Date   groin hernia surgery bilat     age 79    Social History: Social History   Tobacco Use   Smoking status: Never  Substance Use Topics   Alcohol use: Yes   Drug use: Yes    Family History: Family History  Problem Relation Age of Onset   Hypertension Mother    Heart attack Father        in his 47s; had DFIB    ROS:   Review of Systems  Cardiovascular:  Negative for chest pain, claudication, irregular heartbeat, leg swelling, near-syncope, orthopnea, palpitations, paroxysmal nocturnal dyspnea and syncope.  Respiratory:  Negative for shortness of breath.   Hematologic/Lymphatic: Negative for bleeding problem.    EKGs/Labs/Other Studies Reviewed:   EKG: EKG Interpretation Date/Time:  Wednesday September 09 2024 09:00:06 EDT Ventricular Rate:  45 PR Interval:  160 QRS Duration:  92 QT Interval:  444 QTC Calculation: 384 R Axis:   46  Text Interpretation: Sinus bradycardia No previous ECGs available Confirmed by Large Madonna [phone]) on 09/09/2024 9:21:25 AM   Labs:    Latest Ref Rng & Units 10/12/2008    8:08 PM  CBC  WBC 4.0 - 10.5 10*3/microliter 4.5   Hemoglobin 13.0 - 17.0 g/dL 84.2   Hematocrit 60.9 - 52.0 % 46.0   Platelets 150 - 400 K/uL 194        Latest Ref Rng &  Units 05/02/2010    9:31 PM 10/12/2008    8:08 PM  BMP  Glucose 70 - 99 mg/dL 96  91   BUN 6 - 23 mg/dL 16  19   Creatinine 9.59 - 1.50 mg/dL 8.88  8.96   Sodium 864 - 145 meq/L 137  139   Potassium 3.5 - 5.3 meq/L 4.4  4.5   Chloride 96 - 112 meq/L 103  105   CO2 19 - 32 meq/L 26  24   Calcium 8.4 - 10.5 mg/dL 89.8  9.9       Latest Ref Rng & Units 05/02/2010    9:31 PM 10/12/2008    8:08 PM  CMP  Glucose 70 - 99 mg/dL 96  91   BUN 6 - 23 mg/dL 16  19   Creatinine 9.59 - 1.50 mg/dL 8.88  8.96   Sodium 864 - 145 meq/L 137  139   Potassium 3.5 - 5.3 meq/L 4.4  4.5   Chloride 96 - 112 meq/L 103  105   CO2 19 - 32  meq/L 26  24   Calcium 8.4 - 10.5 mg/dL 89.8  9.9   Total Protein 6.0 - 8.3 g/dL 7.9  7.6   Total Bilirubin 0.3 - 1.2 mg/dL 0.8  0.8   Alkaline Phos 39 - 117 units/L 60  56   AST 0 - 37 units/L 21  23   ALT 0 - 53 units/L 14  21     Lab Results  Component Value Date   CHOL 182 05/02/2010   HDL 49 05/02/2010   LDLCALC 115 (H) 05/02/2010   TRIG 91 05/02/2010   CHOLHDL 3.7 Ratio 05/02/2010   No results for input(s): LIPOA in the last 8760 hours. No components found for: NTPROBNP No results for input(s): PROBNP in the last 8760 hours. No results for input(s): TSH in the last 8760 hours.  Physical Exam:    Today's Vitals   09/09/24 0900  BP: 139/78  Pulse: 94  Resp: 16  SpO2: 98%  Weight: 217 lb 3.2 oz (98.5 kg)  Height: 6' 2 (1.88 m)   Body mass index is 27.89 kg/m. Wt Readings from Last 3 Encounters:  09/09/24 217 lb 3.2 oz (98.5 kg)  04/23/14 216 lb (98 kg)  04/15/12 211 lb (95.7 kg)    Physical Exam  Constitutional: No distress.  hemodynamically stable  Neck: No JVD present.  Cardiovascular: Normal rate, regular rhythm, S1 normal and S2 normal. Exam reveals no gallop, no S3 and no S4.  No murmur heard. Pulses:      Radial pulses are 2+ on the right side and 2+ on the left side.       Dorsalis pedis pulses are 2+ on the right side and 2+ on the left side.       Posterior tibial pulses are 2+ on the right side and 2+ on the left side.  Pulmonary/Chest: Effort normal and breath sounds normal. No stridor. He has no wheezes. He has no rales.  Musculoskeletal:        General: No edema.     Cervical back: Neck supple.  Skin: Skin is warm.   Impression & Recommendation(s):  Impression:   ICD-10-CM   1. Benign hypertension  I10 Comprehensive metabolic panel with GFR    ECHOCARDIOGRAM COMPLETE    Comprehensive metabolic panel with GFR    losartan  (COZAAR ) 25 MG tablet    2. Family history of early CAD  Z82.49 EKG 12-Lead  Lipoprotein A (LPA)    CT  CARDIAC SCORING (SELF PAY ONLY)    3. Screening for hyperlipidemia  Z13.220 Lipid panel    Comprehensive metabolic panel with GFR    Lipoprotein A (LPA)    Lipoprotein A (LPA)    Comprehensive metabolic panel with GFR    Lipid panel       Recommendation(s):  Benign hypertension Home blood pressures are around mid 140 mmHg, per wife No blood pressure log available for review. Emphasize importance of reducing salt in the diet. Start losartan  25 mg p.o. every morning Check CMP in 1 week to evaluate renal function. Echo will be ordered to evaluate for structural heart disease and left ventricular systolic function. Recommend checking blood pressures at home would recommend a goal SBP < 130 mmHg.   He is more than welcome to call us  back for further medication titration or once he establishes with PCP they can take over.  Family history of early CAD States that family has a history of elevated LP(a) level. Premature coronary artery disease affecting his father in his 69s. Patient not having active anginal chest pain. His overall functional capacity remains stable. EKG nonischemic. Coronary calcium score for further risk stratification and check fasting lipids and LP(a) given family history.  Screening for hyperlipidemia Recommended checking fasting lipids with either our practice or once he establishes with PCP. Fasting labs ordered as discussed above  Re emphasized importance of establishing care with PCP to make sure that he has an annual well visit as well as age-appropriate cancer screening.  This was conveyed to both patient and his wife during today's visit.  All questions and concerns were addressed.   Orders Placed:  Orders Placed This Encounter  Procedures   CT CARDIAC SCORING (SELF PAY ONLY)    Standing Status:   Future    Expiration Date:   12/09/2025    [facility]    Radiology Contrast Protocol - do NOT remove file path:    [email or web address]   Lipid panel    Standing Status:   Future    Number of Occurrences:   1    Expected Date:   09/16/2024    Expiration Date:   09/09/2025   Comprehensive metabolic panel with GFR    Standing Status:   Future    Number of Occurrences:   1    Expected Date:   09/16/2024    Expiration Date:   09/09/2025   Lipoprotein A (LPA)    Standing Status:   Future    Number of Occurrences:   1    Expected Date:   09/16/2024    Expiration Date:   09/09/2025   EKG 12-Lead   ECHOCARDIOGRAM COMPLETE    Standing Status:   Future    Expiration Date:   03/09/2025    Where should this test be performed:   [facility]    Does the patient weigh less than or greater than 250 lbs?:   Patient weighs less than 250 lbs    Perflutren DEFINITY (image enhancing agent) should be administered unless hypersensitivity or allergy exist:   Administer Perflutren    Reason for exam-Echo:   Other-Full Diagnosis List    Full ICD-10/Reason for Exam:   HTN (hypertension) [761881]     Final Medication List:    Meds ordered this encounter  Medications   losartan  (COZAAR ) 25 MG tablet    Sig: Take 1 tablet (  25 mg total) by mouth every morning.    Dispense:  90 tablet    Refill:  3    There are no discontinued medications.   Current Outpatient Medications:    losartan  (COZAAR ) 25 MG tablet, Take 1 tablet (25 mg total) by mouth every morning., Disp: 90 tablet, Rfl: 3   Multiple Vitamin (MULTIVITAMIN) tablet, Take 1 tablet by mouth daily., Disp: , Rfl:   Consent:   N/A  Disposition:   1 year follow-up sooner if needed. Patient may be asked to follow-up sooner based on the results of the above-mentioned testing.  His questions and concerns were addressed to his satisfaction. He voices understanding of the recommendations provided during this encounter.    Signed, Madonna Michele HAS, [facility]  A Division of [facility] [address] [place], [place] [address]  09/09/2024 11:16 AM

## 2024-09-09 NOTE — Patient Instructions (Addendum)
 Medication Instructions:  Start Losartan  25 mg take one tablet in the mornings  *If you need a refill on your cardiac medications before your next appointment, please call your pharmacy*  Lab Work: Fasting lipid panel , CMP, Lpa in 1 week. (Nothing to eat after midnight the night before your lab work. We encourage you, to drink water)  Testing/Procedures: Coronary Calcium Score Testing   Your physician has requested that you have an echocardiogram. Echocardiography is a painless test that uses sound waves to create images of your heart. It provides your doctor with information about the size and shape of your heart and how well your heart's chambers and valves are working. This procedure takes approximately one hour. There are no restrictions for this procedure. Please do NOT wear cologne, perfume, aftershave, or lotions (deodorant is allowed). Please arrive 15 minutes prior to your appointment time.  Please note: We ask at that you not bring children with you during ultrasound (echo/ vascular) testing. Due to room size and safety concerns, children are not allowed in the ultrasound rooms during exams. Our front office staff cannot provide observation of children in our lobby area while testing is being conducted. An adult accompanying a patient to their appointment will only be allowed in the ultrasound room at the discretion of the ultrasound technician under special circumstances. We apologize for any inconvenience.   Follow-Up: At [facility], you and your health needs are our priority.  As part of our continuing mission to provide you with exceptional heart care, our providers are all part of one team.  This team includes your primary Cardiologist (physician) and Advanced Practice Providers or APPs (Physician Assistants and Nurse Practitioners) who all work together to provide you with the care you need, when you need it.  Your next appointment:   1 year(s)  Provider:   Dr.  Michele    We recommend signing up for the patient portal called MyChart.  Sign up information is provided on this After Visit Summary.  MyChart is used to connect with patients for Virtual Visits (Telemedicine).  Patients are able to view lab/test results, encounter notes, upcoming appointments, etc.  Non-urgent messages can be sent to your provider as well.   To learn more about what you can do with MyChart, go to [email or web address].   Other Instructions Establish care with a Primary Care Physician

## 2024-09-22 ENCOUNTER — Ambulatory Visit: Payer: Self-pay

## 2024-09-22 DIAGNOSIS — Z13220 Encounter for screening for lipoid disorders: Secondary | ICD-10-CM

## 2024-09-22 DIAGNOSIS — Z8249 Family history of ischemic heart disease and other diseases of the circulatory system: Secondary | ICD-10-CM

## 2024-09-22 LAB — COMPREHENSIVE METABOLIC PANEL WITH GFR
ALT: 28 IU/L (ref 0–44)
AST: 40 IU/L (ref 0–40)
Albumin: 4.4 g/dL (ref 4.1–5.1)
Alkaline Phosphatase: 59 IU/L (ref 47–123)
BUN/Creatinine Ratio: 17 (ref 9–20)
BUN: 19 mg/dL (ref 6–24)
Bilirubin Total: 1 mg/dL (ref 0.0–1.2)
CO2: 21 mmol/L (ref 20–29)
Calcium: 9.4 mg/dL (ref 8.7–10.2)
Chloride: 102 mmol/L (ref 96–106)
Creatinine, Ser: 1.1 mg/dL (ref 0.76–1.27)
Globulin, Total: 1.9 g/dL (ref 1.5–4.5)
Glucose: 85 mg/dL (ref 70–99)
Potassium: 4.6 mmol/L (ref 3.5–5.2)
Sodium: 137 mmol/L (ref 134–144)
Total Protein: 6.3 g/dL (ref 6.0–8.5)
eGFR: 87 mL/min/1.73 (ref >59)

## 2024-09-22 LAB — LIPID PANEL
Chol/HDL Ratio: 3.5 ratio (ref 0.0–5.0)
Cholesterol, Total: 178 mg/dL (ref 100–199)
HDL: 51 mg/dL (ref >39)
LDL Chol Calc (NIH): 119 mg/dL — ABNORMAL HIGH (ref 0–99)
Triglycerides: 41 mg/dL (ref 0–149)
VLDL Cholesterol Cal: 8 mg/dL (ref 5–40)

## 2024-09-22 LAB — LIPOPROTEIN A (LPA): Lipoprotein (a): 106.9 nmol/L — ABNORMAL HIGH (ref <75.0)

## 2024-10-05 ENCOUNTER — Other Ambulatory Visit: Payer: Self-pay

## 2024-10-05 MED ORDER — LOSARTAN POTASSIUM 25 MG PO TABS: 25.0000 mg | ORAL_TABLET | Freq: Every morning | ORAL | 3 refills | Status: DC

## 2024-10-05 NOTE — Addendum Note (Signed)
 Addended by: GEORGINA HILA on: 10/05/2024 12:59 PM   Modules accepted: Orders

## 2024-10-08 ENCOUNTER — Other Ambulatory Visit: Payer: Self-pay | Admitting: Physician Assistant

## 2024-10-08 DIAGNOSIS — E7849 Other hyperlipidemia: Secondary | ICD-10-CM

## 2024-10-12 ENCOUNTER — Ambulatory Visit (HOSPITAL_COMMUNITY)
Admission: RE | Admit: 2024-10-12 | Discharge: 2024-10-12 | Disposition: A | Discharge status: Home / Self Care | Payer: Self-pay | Source: Ambulatory Visit | Attending: Cardiology | Admitting: Cardiology

## 2024-10-12 ENCOUNTER — Ambulatory Visit (HOSPITAL_COMMUNITY)
Admission: RE | Admit: 2024-10-12 | Discharge: 2024-10-12 | Disposition: A | Discharge status: Home / Self Care | Source: Ambulatory Visit | Attending: Cardiology | Admitting: Cardiology

## 2024-10-12 DIAGNOSIS — I10 Essential (primary) hypertension: Secondary | ICD-10-CM | POA: Insufficient documentation

## 2024-10-12 DIAGNOSIS — Z8249 Family history of ischemic heart disease and other diseases of the circulatory system: Secondary | ICD-10-CM | POA: Insufficient documentation

## 2024-10-12 LAB — ECHOCARDIOGRAM COMPLETE
Area-P 1/2: 3.48 cm2
S' Lateral: 3.3 cm

## 2024-10-14 MED ORDER — ROSUVASTATIN CALCIUM 10 MG PO TABS: 10.0000 mg | ORAL_TABLET | Freq: Every day | ORAL | 3 refills | Status: DC

## 2024-10-24 LAB — COMPREHENSIVE METABOLIC PANEL WITH GFR
ALT: 20 IU/L (ref 0–44)
AST: 27 IU/L (ref 0–40)
Albumin: 4.6 g/dL (ref 4.1–5.1)
Alkaline Phosphatase: 53 IU/L (ref 47–123)
BUN/Creatinine Ratio: 11 (ref 9–20)
BUN: 12 mg/dL (ref 6–24)
Bilirubin Total: 0.9 mg/dL (ref 0.0–1.2)
CO2: 25 mmol/L (ref 20–29)
Calcium: 9.2 mg/dL (ref 8.7–10.2)
Chloride: 105 mmol/L (ref 96–106)
Creatinine, Ser: 1.1 mg/dL (ref 0.76–1.27)
Globulin, Total: 2 g/dL (ref 1.5–4.5)
Glucose: 86 mg/dL (ref 70–99)
Potassium: 4.3 mmol/L (ref 3.5–5.2)
Sodium: 141 mmol/L (ref 134–144)
Total Protein: 6.6 g/dL (ref 6.0–8.5)
eGFR: 87 mL/min/1.73 (ref >59)

## 2024-10-24 LAB — LIPID PANEL
Chol/HDL Ratio: 2.8 ratio (ref 0.0–5.0)
Cholesterol, Total: 129 mg/dL (ref 100–199)
HDL: 46 mg/dL (ref >39)
LDL Chol Calc (NIH): 73 mg/dL (ref 0–99)
Triglycerides: 40 mg/dL (ref 0–149)
VLDL Cholesterol Cal: 10 mg/dL (ref 5–40)

## 2024-10-26 NOTE — Progress Notes (Signed)
 MyChart message containing providers result note and interpretation read by patient on Last read by Celestine GORMAN Bill at 7:31AM on 10/26/2024.

## 2024-11-30 ENCOUNTER — Ambulatory Visit: Admitting: Cardiology

## 2024-11-30 ENCOUNTER — Encounter: Payer: Self-pay | Admitting: Cardiology

## 2024-11-30 VITALS — BP 124/86 | HR 60 | Resp 16 | Ht 74.0 in | Wt 219.6 lb

## 2024-11-30 DIAGNOSIS — I10 Essential (primary) hypertension: Secondary | ICD-10-CM | POA: Diagnosis not present

## 2024-11-30 DIAGNOSIS — E782 Mixed hyperlipidemia: Secondary | ICD-10-CM | POA: Diagnosis not present

## 2024-11-30 DIAGNOSIS — Z8249 Family history of ischemic heart disease and other diseases of the circulatory system: Secondary | ICD-10-CM

## 2024-11-30 DIAGNOSIS — R931 Abnormal findings on diagnostic imaging of heart and coronary circulation: Secondary | ICD-10-CM

## 2024-11-30 DIAGNOSIS — E7841 Elevated Lipoprotein(a): Secondary | ICD-10-CM | POA: Diagnosis not present

## 2024-11-30 MED ORDER — ROSUVASTATIN CALCIUM 10 MG PO TABS: 10.0000 mg | ORAL_TABLET | Freq: Every day | ORAL | 3 refills | Status: AC

## 2024-11-30 MED ORDER — LOSARTAN POTASSIUM 25 MG PO TABS: 25.0000 mg | ORAL_TABLET | Freq: Every morning | ORAL | 3 refills | Status: AC

## 2024-11-30 NOTE — Progress Notes (Signed)
 Cardiology Office Note:    Date:  11/30/2024  NAME:  [name] [name]    MRN: [id number] DOB:  December 07, 1986   PCP:  Default, Provider, MD  Former Cardiology Providers: None Primary Cardiologist:  Madonna Large, DO, [facility] (established care 09/09/2024) Electrophysiologist:  None    Chief Complaint  Patient presents with   Hypertension   Follow-up    Coronary calcification, reviewed test results    History of Present Illness:    [name] [name] is a 38 y.o. Caucasian male whose past medical history and cardiovascular risk factors includes: Moderate coronary calcification, hyperlipidemia, family history of heart disease, hypertension.  Patient has self-referred into practice to establish care.    Patient self-referred to [place] to the practice back in September 2025 given hypertension and family history of heart disease.  Patient stated that his blood pressures at home are consistently mid 140 mmHg or higher.  He was started on losartan  25 mg p.o. daily and repeat labs 09/21/2024 noted stable renal function.  LP(a) was elevated.  Coronary calcium  score noted moderate CAC placing him at the 95th percentile.  Given his strong family history of CAD, moderate CAC, and elevated LP(a) recommended Crestor  10 mg p.o. nightly.  His LDL levels improved from 119 to 73 mg/dL with stable liver function.  Patient presents today for follow-up.  Denies anginal chest pain or heart failure symptoms. Blood pressures at home are now very well-controlled with SBP <120 mmHg. He is tolerating Crestor  without any side effects or intolerances. We updated his family history as mentioned below, dad had his first MI at the age of 33 and a CABG in his 38s.  Family history of coronary artery disease. States that the family has a history of elevated LP(a). Mom and Dad - HTN  DAD - MI at age of 55 and CABG (in his 72s) 2 half sister - no known CAD   Current Medications: Current Meds  Medication Sig   Multiple  Vitamin (MULTIVITAMIN) tablet Take 1 tablet by mouth daily.   [DISCONTINUED] losartan  (COZAAR ) 25 MG tablet Take 1 tablet (25 mg total) by mouth every morning.   [DISCONTINUED] rosuvastatin  (CRESTOR ) 10 MG tablet Take 1 tablet (10 mg total) by mouth daily.  Over-the-counter omega supplements 1280 mg 2 tabs daily  Allergies:    Patient has no known allergies.   Past Medical History: History reviewed. No pertinent past medical history.  Past Surgical History: Past Surgical History:  Procedure Laterality Date   groin hernia surgery bilat     age 70    Social History: Social History   Tobacco Use   Smoking status: Never  Substance Use Topics   Alcohol use: Yes   Drug use: Yes    Family History: Family History  Problem Relation Age of Onset   Hypertension Mother    Heart attack Father        in his 44s; had DFIB    ROS:   Review of Systems  Cardiovascular:  Negative for chest pain, claudication, irregular heartbeat, leg swelling, near-syncope, orthopnea, palpitations, paroxysmal nocturnal dyspnea and syncope.  Respiratory:  Negative for shortness of breath.   Hematologic/Lymphatic: Negative for bleeding problem.    EKGs/Labs/Other Studies Reviewed:    Echo  10/12/24 1. Left ventricular ejection fraction, by estimation, is 55 to 60%. Left ventricular ejection fraction by 3D volume is 58 %. The left ventricle has normal function. The left ventricle has no regional wall motion abnormalities. Left ventricular diastolic parameters  were normal. The average left ventricular global longitudinal strain is -27.6 %. The global longitudinal strain is normal. 2. Right ventricular systolic function is normal. The right ventricular size is normal. Tricuspid regurgitation signal is inadequate for assessing PA pressure. 3. Left atrial size was mildly dilated. 4. The mitral valve is normal in structure. Trivial mitral valve regurgitation. No evidence of mitral stenosis. 5. The aortic  valve is tricuspid. Aortic valve regurgitation is not visualized. No aortic stenosis is present. 6. Ascending aorta measurements are within normal limits for age when indexed to body surface area. 7. The inferior vena cava is dilated in size with >50% respiratory variability, suggesting right atrial pressure of 8 mmHg.   Coronary artery calcium  score: 10/12/2024. Total coronary calcium  score 145, 97th percentile. Radiology over read: No significant extracardiac findings   Labs:    Latest Ref Rng & Units 10/12/2008    8:08 PM  CBC  WBC 4.0 - 10.5 10*3/microliter 4.5   Hemoglobin 13.0 - 17.0 g/dL 84.2   Hematocrit 60.9 - 52.0 % 46.0   Platelets 150 - 400 K/uL 194        Latest Ref Rng & Units 10/23/2024    9:53 AM 09/21/2024   10:02 AM 05/02/2010    9:31 PM  BMP  Glucose 70 - 99 mg/dL 86  85  96   BUN 6 - 24 mg/dL 12  19  16    Creatinine 0.76 - 1.27 mg/dL 8.89  8.89  8.88   BUN/Creat Ratio 9 - 20 11  17     Sodium 134 - 144 mmol/L 141  137  137   Potassium 3.5 - 5.2 mmol/L 4.3  4.6  4.4   Chloride 96 - 106 mmol/L 105  102  103   CO2 20 - 29 mmol/L 25  21  26    Calcium  8.7 - 10.2 mg/dL 9.2  9.4  89.8       Latest Ref Rng & Units 10/23/2024    9:53 AM 09/21/2024   10:02 AM 05/02/2010    9:31 PM  CMP  Glucose 70 - 99 mg/dL 86  85  96   BUN 6 - 24 mg/dL 12  19  16    Creatinine 0.76 - 1.27 mg/dL 8.89  8.89  8.88   Sodium 134 - 144 mmol/L 141  137  137   Potassium 3.5 - 5.2 mmol/L 4.3  4.6  4.4   Chloride 96 - 106 mmol/L 105  102  103   CO2 20 - 29 mmol/L 25  21  26    Calcium  8.7 - 10.2 mg/dL 9.2  9.4  89.8   Total Protein 6.0 - 8.5 g/dL 6.6  6.3  7.9   Total Bilirubin 0.0 - 1.2 mg/dL 0.9  1.0  0.8   Alkaline Phos 47 - 123 IU/L 53  59  60   AST 0 - 40 IU/L 27  40  21   ALT 0 - 44 IU/L 20  28  14      Lab Results  Component Value Date   CHOL 129 10/23/2024   HDL 46 10/23/2024   LDLCALC 73 10/23/2024   TRIG 40 10/23/2024   CHOLHDL 2.8 10/23/2024   Recent Labs     09/21/24 1002  LIPOA 106.9*   No components found for: NTPROBNP No results for input(s): PROBNP in the last 8760 hours. No results for input(s): TSH in the last 8760 hours.  Physical Exam:    Today's Vitals   11/30/24  0808  BP: 124/86  Pulse: 60  Resp: 16  SpO2: 99%  Weight: 219 lb 9.6 oz (99.6 kg)  Height: 6' 2 (1.88 m)   Body mass index is 28.19 kg/m. Wt Readings from Last 3 Encounters:  11/30/24 219 lb 9.6 oz (99.6 kg)  09/09/24 217 lb 3.2 oz (98.5 kg)  04/23/14 216 lb (98 kg)    Physical Exam  Constitutional: No distress.  hemodynamically stable  Neck: No JVD present.  Cardiovascular: Normal rate, regular rhythm, S1 normal and S2 normal. Exam reveals no gallop, no S3 and no S4.  No murmur heard. Pulses:      Radial pulses are 2+ on the right side and 2+ on the left side.       Dorsalis pedis pulses are 2+ on the right side and 2+ on the left side.       Posterior tibial pulses are 2+ on the right side and 2+ on the left side.  Pulmonary/Chest: Effort normal and breath sounds normal. No stridor. He has no wheezes. He has no rales.  Musculoskeletal:        General: No edema.     Cervical back: Neck supple.  Skin: Skin is warm.   Impression & Recommendation(s):  Impression:   ICD-10-CM   1. Benign hypertension  I10 losartan  (COZAAR ) 25 MG tablet    CANCELED: MYOCARDIAL PERFUSION IMAGING    2. Agatston coronary artery calcium  score between 100 and 400  R93.1 Cardiac Stress Test: Informed Consent Details: Physician/Practitioner Attestation; Transcribe to consent form and obtain patient signature    Exercise Tolerance Test    CANCELED: MYOCARDIAL PERFUSION IMAGING    3. Mixed hyperlipidemia  E78.2 rosuvastatin  (CRESTOR ) 10 MG tablet    4. Elevated Lp(a)  E78.41 Cardiac Stress Test: Informed Consent Details: Physician/Practitioner Attestation; Transcribe to consent form and obtain patient signature    CANCELED: MYOCARDIAL PERFUSION IMAGING    5. Family  history of early CAD  Z82.49 Cardiac Stress Test: Informed Consent Details: Physician/Practitioner Attestation; Transcribe to consent form and obtain patient signature    CANCELED: MYOCARDIAL PERFUSION IMAGING        Recommendation(s):  Benign hypertension Office and home blood pressures are now well-controlled. Refill losartan  25 mg p.o. daily for 1 year  Agatston coronary artery calcium  score between 100 and 400 Total CAC 145, placing him at the 97th percentile. Family history of premature CAD, moderate CAC placing him at the 97th percentile, elevated LP(a), index LDL levels were higher than 99 mg/dL. Shared decision was to proceed with Crestor  10 mg p.o. nightly. Lipids have improved. Echocardiogram favorable-preserved LVEF, normal diastolic function, no significant valvular heart disease We discussed the role of stress testing. Since he is asymptomatic initial plan was to hold off; however, patient is an avid when it comes to working out with bikes and running and would like to be evaluated for history and workup thus far.  It is quite reasonable and therefore the shared decision was to proceed forward with GXT.  Elevated Lp(a) Mixed hyperlipidemia Monitor for now Refill Crestor  10 mg p.o. nightly for 1 year  Family history of early CAD Reemphasized importance of secondary prevention.  Patient has an appointment with PCP coming up to establish care. As long as GXT is favorable we will see him back in 1 year or sooner if needed patient agreeable with the plan of care.  Orders Placed:  Orders Placed This Encounter  Procedures   Cardiac Stress Test: Informed Consent Details: Physician/Practitioner  Attestation; Transcribe to consent form and obtain patient signature    Physician/Practitioner attestation of informed consent for procedure/surgical case:   I, the physician/practitioner, attest that I have discussed with the patient the benefits, risks, side effects, alternatives,  likelihood of achieving goals and potential problems during recovery for the procedure that I have provided informed consent.    Procedure:   Exercise Tolerance Stress Test    Indication/Reason:   Coronary calcium  score, family history   Exercise Tolerance Test    Standing Status:   Future    Expiration Date:   11/30/2025    Where [facility] this test [facility] performed[facility]    Stress with pharmacologic or treadmill ?:   Treadmill w/ exercise    Is patient able to ambulate on a treadmill?:   Yes     Final Medication List:    Meds ordered this encounter  Medications   losartan  (COZAAR ) 25 MG tablet    Sig: Take 1 tablet (25 mg total) by mouth every morning.    Dispense:  90 tablet    Refill:  3   rosuvastatin  (CRESTOR ) 10 MG tablet    Sig: Take 1 tablet (10 mg total) by mouth daily.    Dispense:  90 tablet    Refill:  3    Medications Discontinued During This Encounter  Medication Reason   losartan  (COZAAR ) 25 MG tablet Reorder   rosuvastatin  (CRESTOR ) 10 MG tablet Reorder     Current Outpatient Medications:    Multiple Vitamin (MULTIVITAMIN) tablet, Take 1 tablet by mouth daily., Disp: , Rfl:    losartan  (COZAAR ) 25 MG tablet, Take 1 tablet (25 mg total) by mouth every morning., Disp: 90 tablet, Rfl: 3   rosuvastatin  (CRESTOR ) 10 MG tablet, Take 1 tablet (10 mg total) by mouth daily., Disp: 90 tablet, Rfl: 3  Consent:   Informed Consent   Shared Decision Making/Informed Consent The risks [chest pain, shortness of breath, cardiac arrhythmias, dizziness, blood pressure fluctuations, myocardial infarction, stroke/transient ischemic attack, and life-threatening complications (estimated to be 1 in 10,000)], benefits (risk stratification, diagnosing coronary artery disease, treatment guidance) and alternatives of an exercise tolerance test were discussed in detail with Mr. Loveall and he agrees to proceed.    Disposition:   1 year follow-up sooner if needed  His  questions and concerns were addressed to his satisfaction. He voices understanding of the recommendations provided during this encounter.    Signed, Madonna Michele HAS, [facility]  A Division of [facility] [address] [place], [place] [address]  11/30/2024 9:05 AM

## 2024-11-30 NOTE — Patient Instructions (Addendum)
 Medication Instructions:  Your physician recommends that you continue on your current medications as directed. Please refer to the Current Medication list given to you today.  *If you need a refill on your cardiac medications before your next appointment, please call your pharmacy*  Lab Work: None ordered If you have labs (blood work) drawn today and your tests are completely normal, you will receive your results only by: MyChart Message (if you have MyChart) OR A paper copy in the mail If you have any lab test that is abnormal or we need to change your treatment, we will call you to review the results.  Testing/Procedures: Exercise Tolerance Treadmill test  Follow-Up: At [facility], you and your health needs are our priority.  As part of our continuing mission to provide you with exceptional heart care, our providers are all part of one team.  This team includes your primary Cardiologist (physician) and Advanced Practice Providers or APPs (Physician Assistants and Nurse Practitioners) who all work together to provide you with the care you need, when you need it.  Your next appointment:   1 year(s)  Provider:   Madonna Large, DO    We recommend signing up for the patient portal called MyChart.  Sign up information is provided on this After Visit Summary.  MyChart is used to connect with patients for Virtual Visits (Telemedicine).  Patients are able to view lab/test results, encounter notes, upcoming appointments, etc.  Non-urgent messages can be sent to your provider as well.   To learn more about what you can do with MyChart, go to [email or web address].   Other Instructions Exercise Tolerance Test  Please arrive 15 minutes prior to your appointment time for registration and insurance purposes.  The test will take approximately 45 minutes to complete.  How to prepare for your Exercise Stress Test: Do bring a list of your current medications with you.  If not listed  below, you may take your medications as normal. Do wear comfortable clothes (no dresses or overalls) and walking shoes, tennis shoes preferred (no heels or open toed shoes are allowed) Do Not wear cologne, perfume, aftershave or lotions (deodorant is allowed). Please report to [address], [address] for your test.  If these instructions are not followed, your test will have to be rescheduled.  If you have questions or concerns about your appointment, you can call the Stress Lab at [phone].  If you cannot keep your appointment, please provide 24 hours notification to the Stress Lab, to avoid a possible $50 charge to your account.

## 2024-12-31 ENCOUNTER — Telehealth (HOSPITAL_COMMUNITY): Payer: Self-pay | Admitting: *Deleted

## 2024-12-31 NOTE — Telephone Encounter (Signed)
 Reminder call with instructions given for upcoming GXT on 01/04/25 at 2:30

## 2025-01-04 ENCOUNTER — Ambulatory Visit (HOSPITAL_COMMUNITY)
Admission: RE | Admit: 2025-01-04 | Discharge: 2025-01-04 | Disposition: A | Discharge status: Home / Self Care | Source: Ambulatory Visit | Attending: Internal Medicine | Admitting: Internal Medicine

## 2025-01-04 DIAGNOSIS — R931 Abnormal findings on diagnostic imaging of heart and coronary circulation: Secondary | ICD-10-CM | POA: Insufficient documentation

## 2025-01-04 LAB — EXERCISE TOLERANCE TEST
Angina Index: 0
Estimated workload: 13.4
Exercise duration (min): 12 min
MPHR: 180 {beats}/min
Peak HR: 176 {beats}/min
Percent HR: 97 %
Rest HR: 61 {beats}/min

## 2025-01-06 ENCOUNTER — Ambulatory Visit: Payer: Self-pay | Admitting: Cardiology
